# Patient Record
Sex: Male | Born: 2005 | Marital: Single | Smoking: Never smoker
Health system: Southern US, Community
[De-identification: ages and names within clinical notes are randomized; demographics above are authoritative.]

## PROBLEM LIST (undated history)

## (undated) ENCOUNTER — Emergency Department (HOSPITAL_BASED_OUTPATIENT_CLINIC_OR_DEPARTMENT_OTHER): Admission: EM | Payer: Self-pay

---
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
## 2023-12-22 DIAGNOSIS — J189 Pneumonia, unspecified organism: Secondary | ICD-10-CM | POA: Diagnosis not present

## 2023-12-22 DIAGNOSIS — J111 Influenza due to unidentified influenza virus with other respiratory manifestations: Secondary | ICD-10-CM | POA: Diagnosis not present

## 2023-12-28 ENCOUNTER — Ambulatory Visit: Payer: Self-pay | Admitting: Nurse Practitioner
